# Patient Record
Sex: Female | Born: 1965 | Race: Black or African American | Hispanic: No | Marital: Single | Smoking: Never smoker
Health system: Southern US, Community
[De-identification: ages and names within clinical notes are randomized; demographics above are authoritative.]

## PROBLEM LIST (undated history)

## (undated) HISTORY — PX: BREAST EXCISIONAL BIOPSY: SUR124

---

## 2006-05-12 ENCOUNTER — Emergency Department: Payer: Self-pay | Admitting: Emergency Medicine

## 2007-12-04 ENCOUNTER — Ambulatory Visit: Payer: Self-pay | Admitting: Unknown Physician Specialty

## 2008-10-18 ENCOUNTER — Emergency Department: Payer: Self-pay | Admitting: Emergency Medicine

## 2010-11-27 ENCOUNTER — Ambulatory Visit: Payer: Self-pay | Admitting: Unknown Physician Specialty

## 2015-06-26 ENCOUNTER — Emergency Department: Payer: BLUE CROSS/BLUE SHIELD

## 2015-06-26 ENCOUNTER — Encounter: Payer: Self-pay | Admitting: Emergency Medicine

## 2015-06-26 ENCOUNTER — Emergency Department
Admission: EM | Admit: 2015-06-26 | Discharge: 2015-06-26 | Disposition: A | Discharge status: Home / Self Care | Payer: BLUE CROSS/BLUE SHIELD | Attending: Emergency Medicine | Admitting: Emergency Medicine

## 2015-06-26 DIAGNOSIS — R519 Headache, unspecified: Secondary | ICD-10-CM

## 2015-06-26 DIAGNOSIS — R51 Headache: Secondary | ICD-10-CM | POA: Insufficient documentation

## 2015-06-26 MED ORDER — METOCLOPRAMIDE HCL 10 MG PO TABS: 10.0000 mg | ORAL_TABLET | Freq: Three times a day (TID) | ORAL | Status: AC

## 2015-06-26 MED ORDER — METOCLOPRAMIDE HCL 10 MG PO TABS
10.0000 mg | ORAL_TABLET | Freq: Once | ORAL | Status: AC
  Administered 2015-06-26: 10 mg via ORAL
  Filled 2015-06-26: qty 1

## 2015-06-26 MED ORDER — TRAMADOL HCL 50 MG PO TABS
50.0000 mg | ORAL_TABLET | Freq: Once | ORAL | Status: AC
  Administered 2015-06-26: 50 mg via ORAL
  Filled 2015-06-26: qty 1

## 2015-06-26 MED ORDER — TRAMADOL HCL 50 MG PO TABS: 50.0000 mg | ORAL_TABLET | Freq: Four times a day (QID) | ORAL | Status: AC | PRN

## 2015-06-26 NOTE — ED Provider Notes (Signed)
Time Seen: Approximately 1530 I have reviewed the triage notes  Chief Complaint: Headache   History of Present Illness: Lisa Wood is a 49 y.o. female who presents with headache now intermittently for the last 2 weeks. She states the pain is relatively constant but "" worse at times "". She states is been mostly left-sided but there is been occasional right-sided occipital component. She describes some occasional trouble focusing in her left eye but no eye pain or deficits. She states she was seen one other time in the past for headache but no obvious diagnosis was made at that time. She denies any neck pain, photophobia, fever, trauma. Any focal weakness or trouble with speech or swallowing. She's been ambulatory without difficulty and is tried some over-the-counter medication without success which was Excedrin Migraine.   History reviewed. No pertinent past medical history.  There are no active problems to display for this patient.   History reviewed. No pertinent past surgical history.  History reviewed. No pertinent past surgical history.  Current Outpatient Rx  Name  Route  Sig  Dispense  Refill  . metoCLOPramide (REGLAN) 10 MG tablet   Oral   Take 1 tablet (10 mg total) by mouth 3 (three) times daily with meals.   21 tablet   1   . traMADol (ULTRAM) 50 MG tablet   Oral   Take 1 tablet (50 mg total) by mouth every 6 (six) hours as needed.   20 tablet   0     Allergies:  Review of patient's allergies indicates no known allergies.  Family History: No family history on file.  Social History: Social History  Substance Use Topics  . Smoking status: Never Smoker   . Smokeless tobacco: None  . Alcohol Use: No     Review of Systems:   10 point review of systems was performed and was otherwise negative:  Constitutional: No fever Eyes: No visual disturbances ENT: No sore throat, ear pain Cardiac: No chest pain Respiratory: No shortness of breath, wheezing,  or stridor Abdomen: No abdominal pain, no vomiting, No diarrhea Endocrine: No weight loss, No night sweats Extremities: No peripheral edema, cyanosis Skin: No rashes, easy bruising Neurologic: No focal weakness, trouble with speech or swollowing Urologic: No dysuria, Hematuria, or urinary frequency   Physical Exam:  ED Triage Vitals  Enc Vitals Group     BP 06/26/15 1334 136/90 mmHg     Pulse Rate 06/26/15 1334 77     Resp 06/26/15 1334 18     Temp 06/26/15 1334 98.3 F (36.8 C)     Temp Source 06/26/15 1334 Oral     SpO2 06/26/15 1334 99 %     Weight 06/26/15 1334 188 lb (85.276 kg)     Height 06/26/15 1334  (1.6 m)     Head Cir --      Peak Flow --      Pain Score 06/26/15 1334 2     Pain Loc --      Pain Edu? --      Excl. in GC? --     General: Awake , Alert , and Oriented times 3; GCS 15 Head: Normal cephalic , atraumatic Eyes: Pupils equal , round, reactive to light Nose/Throat: No nasal drainage, patent upper airway without erythema or exudate.  Neck: Supple, Full range of motion, No anterior adenopathy or palpable thyroid masses Lungs: Clear to ascultation without wheezes , rhonchi, or rales Heart: Regular rate, regular rhythm without  murmurs , gallops , or rubs Abdomen: Soft, non tender without rebound, guarding , or rigidity; bowel sounds positive and symmetric in all 4 quadrants. No organomegaly .        Extremities: 2 plus symmetric pulses. No edema, clubbing or cyanosis Neurologic: normal ambulation, Motor symmetric without deficits, sensory intact Skin: warm, dry, no rashes    Radiology:    CLINICAL DATA: Headache 2 weeks with blurred vision left eye.  EXAM: CT HEAD WITHOUT CONTRAST  TECHNIQUE: Contiguous axial images were obtained from the base of the skull through the vertex without intravenous contrast.  COMPARISON: 10/19/2008  FINDINGS: Ventricles, cisterns and other CSF spaces are normal. There is no mass, mass effect, shift of  midline structures or acute hemorrhage. No evidence of acute infarction. Moderate focal thinning of the left frontal skull unchanged. Remaining bones and soft tissues are within normal.  IMPRESSION: No acute intracranial findings.   I personally reviewed the radiologic studies     ED Course:  Differential diagnosis includes but is not exclusive to subarachnoid hemorrhage, meningitis, encephalitis, previous head trauma, cavernous venous thrombosis, muscle tension headache, temporal arteritis, migraine or migraine equivalent, etc. I felt given the patient's current clinical presentation and objective findings this most likely was a migraine equivalent headache. Patient was given Ultram and Reglan here in emergency department symptomatic relief.   Assessment: * Migraine equivalent headache   Final Clinical Impression:  Final diagnoses:  Acute nonintractable headache, unspecified headache type     Plan: * Outpatient management Patient was advised to return immediately if condition worsens. Patient was advised to follow up with her primary care physician or other specialized physicians involved and in their current assessment.            Jennye MoccasinBrian S Quigley, MD 06/26/15 2230

## 2015-06-26 NOTE — ED Notes (Signed)
C/o constant headache x 2 weeks, states at times pain is worse than other times, states at times she has some blurred vision to left eye, denies any blurred vision at present, never been dx with migraines

## 2015-06-26 NOTE — Discharge Instructions (Signed)
General Headache Without Cause °A headache is pain or discomfort felt around the head or neck area. There are many causes and types of headaches. In some cases, the cause may not be found.  °HOME CARE  °Managing Pain °· Take over-the-counter and prescription medicines only as told by your doctor. °· Lie down in a dark, quiet room when you have a headache. °· If directed, apply ice to the head and neck area: °· Put ice in a plastic bag. °· Place a towel between your skin and the bag. °· Leave the ice on for 20 minutes, 2-3 times per day. °· Use a heating pad or hot shower to apply heat to the head and neck area as told by your doctor. °· Keep lights dim if bright lights bother you or make your headaches worse. °Eating and Drinking °· Eat meals on a regular schedule. °· Lessen how much alcohol you drink. °· Lessen how much caffeine you drink, or stop drinking caffeine. °General Instructions °· Keep all follow-up visits as told by your doctor. This is important. °· Keep a journal to find out if certain things bring on headaches. For example, write down: °· What you eat and drink. °· How much sleep you get. °· Any change to your diet or medicines. °· Relax by getting a massage or doing other relaxing activities. °· Lessen stress. °· Sit up straight. Do not tighten (tense) your muscles. °· Do not use tobacco products. This includes cigarettes, chewing tobacco, or e-cigarettes. If you need help quitting, ask your doctor. °· Exercise regularly as told by your doctor. °· Get enough sleep. This often means 7-9 hours of sleep. °GET HELP IF: °· Your symptoms are not helped by medicine. °· You have a headache that feels different than the other headaches. °· You feel sick to your stomach (nauseous) or you throw up (vomit). °· You have a fever. °GET HELP RIGHT AWAY IF:  °· Your headache becomes really bad. °· You keep throwing up. °· You have a stiff neck. °· You have trouble seeing. °· You have trouble speaking. °· You have  pain in the eye or ear. °· Your muscles are weak or you lose muscle control. °· You lose your balance or have trouble walking. °· You feel like you will pass out (faint) or you pass out. °· You have confusion. °  °This information is not intended to replace advice given to you by your health care provider. Make sure you discuss any questions you have with your health care provider. °  °Document Released: 05/08/2008 Document Revised: 04/20/2015 Document Reviewed: 11/22/2014 °Elsevier Interactive Patient Education ©2016 Else[employer] Inc. ° °Migraine Headache °A migraine headache is an intense, throbbing pain on one or both sides of your head. A migraine can last for 30 minutes to several hours. °CAUSES  °The exact cause of a migraine headache is not always known. However, a migraine may be caused when nerves in the brain become irritated and release chemicals that cause inflammation. This causes pain. °Certain things may also trigger migraines, such as: °· Alcohol. °· Smoking. °· Stress. °· Menstruation. °· Aged cheeses. °· Foods or drinks that contain nitrates, glutamate, aspartame, or tyramine. °· Lack of sleep. °· Chocolate. °· Caffeine. °· Hunger. °· Physical exertion. °· Fatigue. °· Medicines used to treat chest pain (nitroglycerine), birth control pills, estrogen, and some blood pressure medicines. °SIGNS AND SYMPTOMS °· Pain on one or both sides of your head. °· Pulsating or throbbing pain. °· Severe   pain that prevents daily activities. °· Pain that is aggravated by any physical activity. °· Nausea, vomiting, or both. °· Dizziness. °· Pain with exposure to bright lights, loud noises, or activity. °· General sensitivity to bright lights, loud noises, or smells. °Before you get a migraine, you may get warning signs that a migraine is coming (aura). An aura may include: °· Seeing flashing lights. °· Seeing bright spots, halos, or zigzag lines. °· Having tunnel vision or blurred vision. °· Having feelings of numbness  or tingling. °· Having trouble talking. °· Having muscle weakness. °DIAGNOSIS  °A migraine headache is often diagnosed based on: °· Symptoms. °· Physical exam. °· A CT scan or MRI of your head. These imaging tests cannot diagnose migraines, but they can help rule out other causes of headaches. °TREATMENT °Medicines may be given for pain and nausea. Medicines can also be given to help prevent recurrent migraines.  °HOME CARE INSTRUCTIONS °· Only take over-the-counter or prescription medicines for pain or discomfort as directed by your health care provider. The use of long-term narcotics is not recommended. °· Lie down in a dark, quiet room when you have a migraine. °· Keep a journal to find out what may trigger your migraine headaches. For example, write down: °¨ What you eat and drink. °¨ How much sleep you get. °¨ Any change to your diet or medicines. °· Limit alcohol consumption. °· Quit smoking if you smoke. °· Get 7-9 hours of sleep, or as recommended by your health care provider. °· Limit stress. °· Keep lights dim if bright lights bother you and make your migraines worse. °SEEK IMMEDIATE MEDICAL CARE IF:  °· Your migraine becomes severe. °· You have a fever. °· You have a stiff neck. °· You have vision loss. °· You have muscular weakness or loss of muscle control. °· You start losing your balance or have trouble walking. °· You feel faint or pass out. °· You have severe symptoms that are different from your first symptoms. °MAKE SURE YOU:  °· Understand these instructions. °· Will watch your condition. °· Will get help right away if you are not doing well or get worse. °  °This information is not intended to replace advice given to you by your health care provider. Make sure you discuss any questions you have with your health care provider. °  °Document Released: 07/30/2005 Document Revised: 08/20/2014 Document Reviewed: 04/06/2013 °Elsevier Interactive Patient Education ©2016 Elsevier Inc. ° °

## 2017-04-17 ENCOUNTER — Other Ambulatory Visit: Payer: Self-pay | Admitting: Internal Medicine

## 2017-04-17 DIAGNOSIS — Z1231 Encounter for screening mammogram for malignant neoplasm of breast: Secondary | ICD-10-CM

## 2017-05-10 ENCOUNTER — Ambulatory Visit
Admission: RE | Admit: 2017-05-10 | Discharge: 2017-05-10 | Disposition: A | Discharge status: Home / Self Care | Payer: BLUE CROSS/BLUE SHIELD | Source: Ambulatory Visit | Attending: Internal Medicine | Admitting: Internal Medicine

## 2017-05-10 DIAGNOSIS — Z1231 Encounter for screening mammogram for malignant neoplasm of breast: Secondary | ICD-10-CM | POA: Insufficient documentation

## 2017-05-15 ENCOUNTER — Inpatient Hospital Stay
Admission: RE | Admit: 2017-05-15 | Discharge: 2017-05-15 | Disposition: A | Discharge status: Home / Self Care | Payer: Self-pay | Source: Ambulatory Visit | Attending: *Deleted | Admitting: *Deleted

## 2017-05-15 ENCOUNTER — Other Ambulatory Visit: Payer: Self-pay | Admitting: *Deleted

## 2017-05-15 DIAGNOSIS — Z9289 Personal history of other medical treatment: Secondary | ICD-10-CM

## 2018-04-02 ENCOUNTER — Other Ambulatory Visit: Payer: Self-pay | Admitting: Internal Medicine

## 2018-04-02 DIAGNOSIS — Z1231 Encounter for screening mammogram for malignant neoplasm of breast: Secondary | ICD-10-CM

## 2018-07-25 ENCOUNTER — Ambulatory Visit
Admission: RE | Admit: 2018-07-25 | Discharge: 2018-07-25 | Disposition: A | Discharge status: Home / Self Care | Payer: BLUE CROSS/BLUE SHIELD | Source: Ambulatory Visit | Attending: Internal Medicine | Admitting: Internal Medicine

## 2018-07-25 DIAGNOSIS — Z1231 Encounter for screening mammogram for malignant neoplasm of breast: Secondary | ICD-10-CM | POA: Insufficient documentation

## 2019-03-10 ENCOUNTER — Other Ambulatory Visit: Payer: Self-pay

## 2019-03-10 ENCOUNTER — Ambulatory Visit: Payer: Self-pay

## 2019-03-10 VITALS — BP 112/88 | HR 99 | Temp 98.9°F | Resp 20

## 2019-03-10 DIAGNOSIS — T675XXA Heat exhaustion, unspecified, initial encounter: Secondary | ICD-10-CM

## 2019-03-10 LAB — GLUCOSE, POCT (MANUAL RESULT ENTRY): POC Glucose: 109 mg/dl — AB (ref 70–99)

## 2019-03-10 NOTE — Patient Instructions (Signed)
Syncope Syncope is when you pass out (faint) for a short time. It is caused by a sudden decrease in blood flow to the brain. Signs that you may be about to pass out include:  Feeling dizzy or light-headed.  Feeling sick to your stomach (nauseous).  Seeing all white or all black.  Having cold, clammy skin. If you pass out, get help right away. Call your local emergency services (911 in the [place] Do not drive yourself to the hospital. Follow these instructions at home: Watch for any changes in your symptoms. Take these actions to stay safe and help with your symptoms: Lifestyle  Do not drive, use machinery, or play sports until your doctor says it is okay.  Do not drink alcohol.  Do not use any products that contain nicotine or tobacco, such as cigarettes and e-cigarettes. If you need help quitting, ask your doctor.  Drink enough fluid to keep your pee (urine) pale yellow. General instructions  Take over-the-counter and prescription medicines only as told by your doctor.  If you are taking blood pressure or heart medicine, sit up and stand up slowly. Spend a few minutes getting ready to sit and then stand. This can help you feel less dizzy.  Have someone stay with you until you feel stable.  If you start to feel like you might pass out, lie down right away and raise (elevate) your feet above the level of your heart. Breathe deeply and steadily. Wait until all of the symptoms are gone.  Keep all follow-up visits as told by your doctor. This is important. Get help right away if:  You have a very bad headache.  You pass out once or more than once.  You have pain in your chest, belly, or back.  You have a very fast or uneven heartbeat (palpitations).  It hurts to breathe.  You are bleeding from your mouth or your bottom (rectum).  You have black or tarry poop (stool).  You have jerky movements that you cannot control (seizure).  You are confused.  You have trouble  walking.  You are very weak.  You have vision problems. These symptoms may be an emergency. Do not wait to see if the symptoms will go away. Get medical help right away. Call your local emergency services (911 in the [place] Do not drive yourself to the hospital. Summary  Syncope is when you pass out (faint) for a short time. It is caused by a sudden decrease in blood flow to the brain.  Signs that you may be about to faint include feeling dizzy, light-headed, or sick to your stomach, seeing all white or all black, or having cold, clammy skin.  If you start to feel like you might pass out, lie down right away and raise (elevate) your feet above the level of your heart. Breathe deeply and steadily. Wait until all of the symptoms are gone. This information is not intended to replace advice given to you by your health care provider. Make sure you discuss any questions you have with your health care provider. Document Released: 01/16/2008 Document Revised: 09/11/2017 Document Reviewed: 09/11/2017 Elsevier Patient Education  2020 Elsevier Inc.   Foot LockerHeat Exhaustion Heat exhaustion happens when your body gets overheated from hot weather or from exercise. It is important to take care of yourself and to treat heat exhaustion as soon as possible. Untreated heat exhaustion can lead to heat cramps, which are the mildest form of heat-related illness. If untreated, heat exhaustion can lead  to heat stroke, which is a life-threatening condition that requires emergency care. What are the causes? Common causes of this condition include:  Exercising or being active in hot or humid weather.  Exposure to hot or humid weather.  Spending time outdoors in the bright sunshine.  Not drinking enough water.  Being dehydrated.  Being overdressed in warm weather. What increases the risk? The following factors may make you more likely to develop this condition:  Exercising beyond your fitness level.  Wearing  clothing that does not allow sweat to evaporate.  Drinking a lot of alcoholic beverages or beverages that have caffeine. This can lead to dehydration.  Being age 53 or older.  Being a child.  Having a medical condition such as heart disease, poor circulation, sickle cell disease, or high blood pressure.  Having a fever.  Being very overweight (obese).  Taking certain medicines. These include medicines for high blood pressure, antihistamines, tranquilizers, and illegal drugs such as cocaine and amphetamines. What are the signs or symptoms? Symptoms of heat exhaustion include:  Heavy sweating along with feeling weak, dizzy, light-headed, and nauseous.  Rapid heartbeat.  Headache.  Urine that is darker than normal.  Muscle cramps, such as in the leg or side (flank).  Moist, cool, and clammy skin.  Fatigue.  Thirst.  Confusion.  Fainting. Signs and symptoms may develop suddenly or over time. How is this diagnosed? This condition may be diagnosed with a physical exam. The diagnosis is confirmed when core body temperature, measured by a rectal thermometer, reaches 104.64F degrees or higher. How is this treated? This condition may be treated by having:  Cooling blankets placed on you to lower your body temperature.  Fluids given to you through an IV in a hospital. Follow these instructions at home: If you think that you have heat exhaustion, call your health care provider. Follow his or her instructions. You should also:  Ask a friend or a family member to stay with you.  Move to a cooler location, such as: ? Into the shade. ? In front of a fan. ? Into an air-conditioned space.  Lie down and rest.  Slowly drink nonalcoholic, caffeine-free fluids.  Take off tight clothing or extra clothing.  Take a cool bath or shower, if possible. If you do not have access to a bath or shower, dab or mist cool water on your skin.  If symptoms last for more than 30 minutes,  seek medical care. Contact a health care provider if:  Symptoms last for more than 30 minutes. Get help right away if:  You have any symptoms of heat stroke. These include: ? Fever. ? Vomiting. ? Red skin. ? Inability to sweat, resulting in hot, dry skin. ? Excessive thirst. ? Rapid breathing. ? Headache. ? Confusion or disorientation. ? Fainting. ? Seizures. These symptoms may represent a serious problem that is an emergency. Do not wait to see if the symptoms will go away. Get medical help right away. Call your local emergency services (911 in the [place] Do not drive yourself to the hospital. Summary  Heat exhaustion happens when your body gets overheated from hot weather, exercise, or dehydration.  Symptoms include heavy sweating, confusion, rapid heartbeat, and muscle cramps.  If your symptoms last for more than 30 minutes, contact a health care provider. This information is not intended to replace advice given to you by your health care provider. Make sure you discuss any questions you have with your health care provider. Document Released: 05/08/2008  Document Revised: 04/24/2018 Document Reviewed: 04/24/2018 Elsevier Patient Education  2020 [employer].

## 2019-03-10 NOTE — Addendum Note (Signed)
Addended by: Concha Pyo on: 03/10/2019 04:59 PM   Modules accepted: Orders

## 2019-03-10 NOTE — Progress Notes (Signed)
   Subjective:    Patient ID: Lisa Wood, female    DOB: Apr 05, 1966, 53 y.o.   MRN: [id number]  Patient presented to clinic accompanied by HR due to near syncopal episode related to heat exhaustion. Patient stated she was asked to work over and she became light headed & her hearing became diminished.blurry. She attempted to walk to the bathroom to get into a cooler environment but was unable to make it due to her vision being blurry.  Near Syncope This is a new problem. The current episode started today. The problem occurs rarely.      Review of Systems  Cardiovascular: Positive for near-syncope.  All other systems reviewed and are negative.      Objective:   Physical Exam Vitals signs reviewed.     Assessment & Plan:   Wt Readings from Last 3 Encounters:  06/26/15 188 lb (85.3 kg)   Temp Readings from Last 3 Encounters:  03/10/19 98.9 F (37.2 C) (Temporal)  06/26/15 98.3 F (36.8 C) (Oral)   BP Readings from Last 3 Encounters:  03/10/19 112/88  06/26/15 120/89   Pulse Readings from Last 3 Encounters:  03/10/19 99  06/26/15 76     No results found for: POCGLU         Thank you!!  [facility] Nurse Specialist [place]: [phone]  Cell:  [phone] Website: [email or web address]

## 2019-04-13 ENCOUNTER — Other Ambulatory Visit: Payer: Self-pay | Admitting: Physician Assistant

## 2019-04-13 DIAGNOSIS — Z1231 Encounter for screening mammogram for malignant neoplasm of breast: Secondary | ICD-10-CM

## 2019-06-04 ENCOUNTER — Ambulatory Visit: Payer: Self-pay

## 2019-06-04 ENCOUNTER — Other Ambulatory Visit: Payer: Self-pay

## 2019-06-04 VITALS — BP 110/66 | HR 73 | Resp 18 | Ht 63.0 in | Wt 171.0 lb

## 2019-06-04 DIAGNOSIS — Z008 Encounter for other general examination: Secondary | ICD-10-CM

## 2019-06-04 LAB — POCT LIPID PANEL
HDL: 75
LDL: 97
Non-HDL: 108
POC Glucose: 101 mg/dl — AB (ref 70–99)
TC/HDL: 2.4
TC: 183
TRG: 52

## 2019-06-04 NOTE — Progress Notes (Signed)
     Patient ID: Lisa Wood, female    DOB: Dec 30, 1965, 53 y.o.   MRN: [id number]    Thank you!!  [facility] Nurse Specialist [place]: [phone]  Cell:  [phone] Website: [email or web address]

## 2019-07-27 ENCOUNTER — Ambulatory Visit
Admission: RE | Admit: 2019-07-27 | Discharge: 2019-07-27 | Disposition: A | Discharge status: Home / Self Care | Payer: BC Managed Care – PPO | Source: Ambulatory Visit | Attending: Physician Assistant | Admitting: Physician Assistant

## 2019-07-27 DIAGNOSIS — Z1231 Encounter for screening mammogram for malignant neoplasm of breast: Secondary | ICD-10-CM | POA: Insufficient documentation

## 2019-09-21 ENCOUNTER — Ambulatory Visit: Payer: BC Managed Care – PPO | Attending: Internal Medicine

## 2019-09-21 DIAGNOSIS — Z20822 Contact with and (suspected) exposure to covid-19: Secondary | ICD-10-CM

## 2019-09-22 LAB — NOVEL CORONAVIRUS, NAA: SARS-CoV-2, NAA: NOT DETECTED

## 2019-10-29 ENCOUNTER — Ambulatory Visit: Payer: BC Managed Care – PPO | Attending: Internal Medicine

## 2019-10-29 DIAGNOSIS — Z23 Encounter for immunization: Secondary | ICD-10-CM

## 2019-10-29 NOTE — Progress Notes (Signed)
   Covid-19 Vaccination Clinic  Name:  Lisa Wood    MRN: [id number] DOB: 1965/10/19  10/29/2019  [name] was observed post Covid-19 immunization for 15 minutes without incident. She was provided with Vaccine Information Sheet and instruction to access the V-Safe system.   [name] was instructed to call 911 with any severe reactions post vaccine: Marland Kitchen Difficulty breathing  . Swelling of face and throat  . A fast heartbeat  . A bad rash all over body  . Dizziness and weakness   Immunizations Administered    Name Date Dose VIS Date Route   Pfizer COVID-19 Vaccine 10/29/2019 11:56 AM 0.3 mL 07/24/2019 Intramuscular   Manufacturer: [employer], [employer]   Lot: [id number]   NDC: [address]-6

## 2019-11-24 ENCOUNTER — Ambulatory Visit: Payer: BC Managed Care – PPO | Attending: Internal Medicine

## 2019-11-24 DIAGNOSIS — Z23 Encounter for immunization: Secondary | ICD-10-CM

## 2019-11-24 NOTE — Progress Notes (Signed)
   Covid-19 Vaccination Clinic  Name:  JANISHA BUESO    MRN: [id number] DOB: July 18, 1966  11/24/2019  Ms. Zupko was observed post Covid-19 immunization for 15 minutes without incident. She was provided with Vaccine Information Sheet and instruction to access the V-Safe system.   Ms. Grams was instructed to call 911 with any severe reactions post vaccine: Marland Kitchen Difficulty breathing  . Swelling of face and throat  . A fast heartbeat  . A bad rash all over body  . Dizziness and weakness   Immunizations Administered    Name Date Dose VIS Date Route   Pfizer COVID-19 Vaccine 11/24/2019  3:37 PM 0.3 mL 07/24/2019 Intramuscular   Manufacturer: [employer], [employer]   Lot: [id number]   NDC: [address]-0

## 2019-12-03 ENCOUNTER — Emergency Department
Admission: EM | Admit: 2019-12-03 | Discharge: 2019-12-03 | Disposition: A | Discharge status: Home / Self Care | Payer: BC Managed Care – PPO | Attending: Emergency Medicine | Admitting: Emergency Medicine

## 2019-12-03 ENCOUNTER — Other Ambulatory Visit: Payer: Self-pay

## 2019-12-03 DIAGNOSIS — Z79899 Other long term (current) drug therapy: Secondary | ICD-10-CM | POA: Diagnosis not present

## 2019-12-03 DIAGNOSIS — G43009 Migraine without aura, not intractable, without status migrainosus: Secondary | ICD-10-CM | POA: Insufficient documentation

## 2019-12-03 DIAGNOSIS — R519 Headache, unspecified: Secondary | ICD-10-CM | POA: Diagnosis present

## 2019-12-03 MED ORDER — DIPHENHYDRAMINE HCL 50 MG/ML IJ SOLN
25.0000 mg | Freq: Once | INTRAMUSCULAR | Status: AC
  Administered 2019-12-03: 25 mg via INTRAVENOUS
  Filled 2019-12-03: qty 1

## 2019-12-03 MED ORDER — BUTALBITAL-APAP-CAFFEINE 50-325-40 MG PO TABS: 1.0000 | ORAL_TABLET | Freq: Four times a day (QID) | ORAL | 0 refills | Status: AC | PRN

## 2019-12-03 MED ORDER — LACTATED RINGERS IV BOLUS
1000.0000 mL | Freq: Once | INTRAVENOUS | Status: AC
  Administered 2019-12-03: 08:00:00 1000 mL via INTRAVENOUS

## 2019-12-03 MED ORDER — KETOROLAC TROMETHAMINE 30 MG/ML IJ SOLN
15.0000 mg | Freq: Once | INTRAMUSCULAR | Status: AC
  Administered 2019-12-03: 15 mg via INTRAVENOUS
  Filled 2019-12-03: qty 1

## 2019-12-03 MED ORDER — PROCHLORPERAZINE EDISYLATE 10 MG/2ML IJ SOLN
10.0000 mg | Freq: Once | INTRAMUSCULAR | Status: AC
  Administered 2019-12-03: 08:00:00 10 mg via INTRAVENOUS
  Filled 2019-12-03: qty 2

## 2019-12-03 MED ORDER — DEXAMETHASONE SODIUM PHOSPHATE 10 MG/ML IJ SOLN
10.0000 mg | Freq: Once | INTRAMUSCULAR | Status: AC
  Administered 2019-12-03: 10 mg via INTRAVENOUS
  Filled 2019-12-03: qty 1

## 2019-12-03 NOTE — ED Triage Notes (Signed)
Pt in with co migraine states started last Tuesday after getting 2nd covid shot. Does have some vomiting states has had same in the past with her migraines. Has taken tramadol for same without relief.

## 2019-12-03 NOTE — ED Notes (Signed)
Pt received 2nd covid vaccine shot on the 13th and has had intermittent headache and vomitting since then. Pt went to PCP and received nausea and HA meds that only helped a little.

## 2019-12-03 NOTE — Discharge Instructions (Signed)
Drink plenty of fluids  Try to get at least 8 hours of sleep  Try small amounts of caffeine to help with the headache  If headache returns, take the Imitrex prescribed by your doctor within the first 1 hour of onset.  If headache persists, take the Ultram or Fioricet.

## 2019-12-03 NOTE — ED Provider Notes (Signed)
[facility] Emergency Department Provider Note  ____________________________________________   First MD Initiated Contact with Patient 12/03/19 [phone]     (approximate)  I have reviewed the triage vital signs and the nursing notes.   HISTORY  Chief Complaint Headache    HPI Lisa Wood is a 54 y.o. female  With PMHx migraines here with headache. Pt received her second [place] vaccination 4/13. Reports that since then she has had increased frequency of headaches. She states that 24 hr after her shot, she developed a unilateral left-sided aching, throbbing HA similar ot her usual migraines. She went to her PCP and was given tramadol and antiemetics, with mild improvement. However, this HA has returned over past 48 hours. Pain began gradually and she describes it as an aching, throbbing, left sided HA similar in quality and distribution to her usual migraines. No thunderclap onset. No vision changes, no focal numbness or weakness. No fevers. No neck pain or stiffness. No photophobia or phonophobia. No specific alleviating factors.        No past medical history on file.  There are no problems to display for this patient.   Past Surgical History:  Procedure Laterality Date  . BREAST EXCISIONAL BIOPSY Left 1990's   neg    Prior to Admission medications   Medication Sig Start Date End Date Taking? Authorizing Provider  butalbital-acetaminophen-caffeine (FIORICET) 50-325-40 MG tablet Take 1-2 tablets by mouth every 6 (six) hours as needed for migraine. 12/03/19 12/02/20  Duffy Bruce, MD  metoCLOPramide (REGLAN) 10 MG tablet Take 1 tablet (10 mg total) by mouth 3 (three) times daily with meals. 06/26/15 07/03/15  Daymon Larsen, MD  traMADol (ULTRAM) 50 MG tablet Take 1 tablet (50 mg total) by mouth every 6 (six) hours as needed. 06/26/15   Daymon Larsen, MD    Allergies Patient has no known allergies.  Family History  Problem Relation Age of  Onset  . Breast cancer Mother     Social History Social History   Tobacco Use  . Smoking status: Never Smoker  Substance Use Topics  . Alcohol use: No  . Drug use: Not on file    Review of Systems  Review of Systems  Constitutional: Positive for fatigue. Negative for fever.  HENT: Negative for congestion and sore throat.   Eyes: Negative for visual disturbance.  Respiratory: Negative for cough and shortness of breath.   Cardiovascular: Negative for chest pain.  Gastrointestinal: Positive for nausea and vomiting. Negative for abdominal pain and diarrhea.  Genitourinary: Negative for flank pain.  Musculoskeletal: Negative for back pain and neck pain.  Skin: Negative for rash and wound.  Neurological: Positive for headaches. Negative for weakness.  All other systems reviewed and are negative.    ____________________________________________  PHYSICAL EXAM:      VITAL SIGNS: ED Triage Vitals  Enc Vitals Group     BP 12/03/19 0307 133/85     Pulse Rate 12/03/19 0307 78     Resp 12/03/19 0307 20     Temp 12/03/19 0307 98.7 F (37.1 C)     Temp Source 12/03/19 0307 Oral     SpO2 12/03/19 0307 100 %     Weight 12/03/19 0308 172 lb (78 kg)     Height 12/03/19 0308 5\' 3"  (1.6 m)     Head Circumference --      Peak Flow --      Pain Score 12/03/19 0308 10     Pain Loc --  Pain Edu? --      Excl. in GC? --      Physical Exam Vitals and nursing note reviewed.  Constitutional:      General: She is not in acute distress.    Appearance: She is well-developed.  HENT:     Head: Normocephalic and atraumatic.  Eyes:     Conjunctiva/sclera: Conjunctivae normal.  Cardiovascular:     Rate and Rhythm: Normal rate and regular rhythm.     Heart sounds: Normal heart sounds. No murmur. No friction rub.  Pulmonary:     Effort: Pulmonary effort is normal. No respiratory distress.     Breath sounds: Normal breath sounds. No wheezing or rales.  Abdominal:     General: There  is no distension.     Palpations: Abdomen is soft.     Tenderness: There is no abdominal tenderness.  Musculoskeletal:     Cervical back: Neck supple.  Skin:    General: Skin is warm.     Capillary Refill: Capillary refill takes less than 2 seconds.  Neurological:     Mental Status: She is alert and oriented to person, place, and time.     Motor: No abnormal muscle tone.     Comments: Neurological Exam:  Mental Status: Alert and oriented to person, place, and time. Attention and concentration normal. Speech clear. Recent memory is intact. Cranial Nerves: Visual fields grossly intact. EOMI and PERRLA. No nystagmus noted. Facial sensation intact at forehead, maxillary cheek, and chin/mandible bilaterally. No facial asymmetry or weakness. Hearing grossly normal. Uvula is midline, and palate elevates symmetrically. Normal SCM and trapezius strength. Tongue midline without fasciculations. Motor: Muscle strength 5/5 in proximal and distal UE and LE bilaterally. No pronator drift. Muscle tone normal.  Sensation: Intact to light touch in upper and lower extremities distally bilaterally.  Gait: Normal without ataxia. Coordination: Normal FTN bilaterally.          ____________________________________________   LABS (all labs ordered are listed, but only abnormal results are displayed)  Labs Reviewed - No data to display  ____________________________________________  EKG: None ________________________________________  RADIOLOGY All imaging, including plain films, CT scans, and ultrasounds, independently reviewed by me, and interpretations confirmed via formal radiology reads.  ED MD interpretation:   None  Official radiology report(s): No results found.  ____________________________________________  PROCEDURES   Procedure(s) performed (including Critical Care):  Procedures  ____________________________________________  INITIAL IMPRESSION / MDM / ASSESSMENT AND PLAN / ED  COURSE  As part of my medical decision making, I reviewed the following data within the electronic MEDICAL RECORD NUMBER Nursing notes reviewed and incorporated, Old chart reviewed, Notes from prior ED visits, and [place] Controlled Substance Database       *ELISABETH STROM was evaluated in Emergency Department on 12/03/2019 for the symptoms described in the history of present illness. She was evaluated in the context of the global COVID-19 pandemic, which necessitated consideration that the patient might be at risk for infection with the SARS-CoV-2 virus that causes COVID-19. Institutional protocols and algorithms that pertain to the evaluation of patients at risk for COVID-19 are in a state of rapid change based on information released by regulatory bodies including the CDC and federal and state organizations. These policies and algorithms were followed during the patient's care in the ED.  Some ED evaluations and interventions may be delayed as a result of limited staffing during the pandemic.*  Clinical Course as of Dec 02 1944  Thu Dec 03, 2019  0845  54 yo F here with unilateral left-sided HA after [place] vaccination. No focal neurological deficits. HDS. Suspect recurrent migraine. No fever, meningismus, or signs of meningitis or encephalitis. HA is similar in quality, location to her usual migraines so doubt SAH. Will treat with migraine meds, fluids, and re-assess.   [CI]  0932 HA is markedly improved, feeling much better. Tolerating PO. Will dc with supportive care.   [CI]    Clinical Course User Index [CI] Shaune Pollack, MD    Medical Decision Making:  As above.  ____________________________________________  FINAL CLINICAL IMPRESSION(S) / ED DIAGNOSES  Final diagnoses:  Migraine without aura and without status migrainosus, not intractable     MEDICATIONS GIVEN DURING THIS VISIT:  Medications  lactated ringers bolus 1,000 mL (0 mLs Intravenous Stopped 12/03/19 0953)  prochlorperazine  (COMPAZINE) injection 10 mg (10 mg Intravenous Given 12/03/19 0818)  diphenhydrAMINE (BENADRYL) injection 25 mg (25 mg Intravenous Given 12/03/19 0816)  ketorolac (TORADOL) 30 MG/ML injection 15 mg (15 mg Intravenous Given 12/03/19 0817)  dexamethasone (DECADRON) injection 10 mg (10 mg Intravenous Given 12/03/19 1025)     ED Discharge Orders         Ordered    butalbital-acetaminophen-caffeine (FIORICET) 50-325-40 MG tablet  Every 6 hours PRN     12/03/19 0933           Note:  This document was prepared using Dragon voice recognition software and may include unintentional dictation errors.   Shaune Pollack, MD 12/03/19 1946

## 2019-12-03 NOTE — ED Notes (Signed)
Provided warm blankets for pt, TV remote and dimmed lights per request.

## 2019-12-03 NOTE — ED Notes (Signed)
ED Provider at bedside. 

## 2019-12-29 ENCOUNTER — Other Ambulatory Visit: Payer: Self-pay | Admitting: Physician Assistant

## 2019-12-29 DIAGNOSIS — R519 Headache, unspecified: Secondary | ICD-10-CM

## 2019-12-29 DIAGNOSIS — G43009 Migraine without aura, not intractable, without status migrainosus: Secondary | ICD-10-CM

## 2020-01-27 ENCOUNTER — Ambulatory Visit: Payer: BC Managed Care – PPO

## 2020-07-18 ENCOUNTER — Other Ambulatory Visit: Payer: Self-pay | Admitting: Physician Assistant

## 2020-07-18 DIAGNOSIS — Z1231 Encounter for screening mammogram for malignant neoplasm of breast: Secondary | ICD-10-CM

## 2020-07-27 ENCOUNTER — Ambulatory Visit
Admission: RE | Admit: 2020-07-27 | Discharge: 2020-07-27 | Disposition: A | Discharge status: Home / Self Care | Payer: BC Managed Care – PPO | Source: Ambulatory Visit | Attending: Physician Assistant | Admitting: Physician Assistant

## 2020-07-27 ENCOUNTER — Other Ambulatory Visit: Payer: Self-pay

## 2020-07-27 DIAGNOSIS — Z1231 Encounter for screening mammogram for malignant neoplasm of breast: Secondary | ICD-10-CM | POA: Diagnosis present

## 2020-07-29 ENCOUNTER — Other Ambulatory Visit: Payer: Self-pay | Admitting: Orthopedic Surgery

## 2020-07-29 DIAGNOSIS — R29898 Other symptoms and signs involving the musculoskeletal system: Secondary | ICD-10-CM

## 2020-07-29 DIAGNOSIS — M75111 Incomplete rotator cuff tear or rupture of right shoulder, not specified as traumatic: Secondary | ICD-10-CM

## 2020-08-09 ENCOUNTER — Other Ambulatory Visit: Payer: Self-pay

## 2020-08-09 ENCOUNTER — Ambulatory Visit
Admission: RE | Admit: 2020-08-09 | Discharge: 2020-08-09 | Disposition: A | Discharge status: Home / Self Care | Payer: BC Managed Care – PPO | Source: Ambulatory Visit | Attending: Orthopedic Surgery | Admitting: Orthopedic Surgery

## 2020-08-09 DIAGNOSIS — R29898 Other symptoms and signs involving the musculoskeletal system: Secondary | ICD-10-CM | POA: Insufficient documentation

## 2020-08-09 DIAGNOSIS — M75111 Incomplete rotator cuff tear or rupture of right shoulder, not specified as traumatic: Secondary | ICD-10-CM | POA: Diagnosis present

## 2021-06-27 ENCOUNTER — Other Ambulatory Visit: Payer: Self-pay | Admitting: Physician Assistant

## 2021-06-27 DIAGNOSIS — Z1231 Encounter for screening mammogram for malignant neoplasm of breast: Secondary | ICD-10-CM

## 2021-07-28 ENCOUNTER — Other Ambulatory Visit: Payer: Self-pay

## 2021-07-28 ENCOUNTER — Ambulatory Visit
Admission: RE | Admit: 2021-07-28 | Discharge: 2021-07-28 | Disposition: A | Discharge status: Home / Self Care | Payer: BC Managed Care – PPO | Source: Ambulatory Visit | Attending: Physician Assistant | Admitting: Physician Assistant

## 2021-07-28 DIAGNOSIS — Z1231 Encounter for screening mammogram for malignant neoplasm of breast: Secondary | ICD-10-CM | POA: Diagnosis present

## 2022-05-08 ENCOUNTER — Other Ambulatory Visit: Payer: Self-pay | Admitting: Physician Assistant

## 2022-05-08 DIAGNOSIS — Z1231 Encounter for screening mammogram for malignant neoplasm of breast: Secondary | ICD-10-CM

## 2022-07-31 ENCOUNTER — Ambulatory Visit
Admission: RE | Admit: 2022-07-31 | Discharge: 2022-07-31 | Disposition: A | Discharge status: Home / Self Care | Payer: BC Managed Care – PPO | Source: Ambulatory Visit | Attending: Physician Assistant | Admitting: Physician Assistant

## 2022-07-31 DIAGNOSIS — Z1231 Encounter for screening mammogram for malignant neoplasm of breast: Secondary | ICD-10-CM | POA: Insufficient documentation

## 2023-04-22 ENCOUNTER — Ambulatory Visit: Payer: BC Managed Care – PPO

## 2023-04-22 DIAGNOSIS — Z86010 Personal history of colonic polyps: Secondary | ICD-10-CM | POA: Diagnosis not present

## 2023-04-22 DIAGNOSIS — K5730 Diverticulosis of large intestine without perforation or abscess without bleeding: Secondary | ICD-10-CM | POA: Diagnosis not present

## 2023-04-22 DIAGNOSIS — K635 Polyp of colon: Secondary | ICD-10-CM | POA: Diagnosis not present

## 2023-04-22 DIAGNOSIS — Z1211 Encounter for screening for malignant neoplasm of colon: Secondary | ICD-10-CM | POA: Diagnosis present

## 2023-04-22 DIAGNOSIS — K640 First degree hemorrhoids: Secondary | ICD-10-CM | POA: Diagnosis not present

## 2023-04-29 ENCOUNTER — Other Ambulatory Visit: Payer: Self-pay | Admitting: Physician Assistant

## 2023-04-29 DIAGNOSIS — Z1231 Encounter for screening mammogram for malignant neoplasm of breast: Secondary | ICD-10-CM

## 2023-08-13 ENCOUNTER — Ambulatory Visit
Admission: RE | Admit: 2023-08-13 | Discharge: 2023-08-13 | Disposition: A | Discharge status: Home / Self Care | Payer: BC Managed Care – PPO | Source: Ambulatory Visit | Attending: Physician Assistant | Admitting: Physician Assistant

## 2023-08-13 DIAGNOSIS — Z1231 Encounter for screening mammogram for malignant neoplasm of breast: Secondary | ICD-10-CM | POA: Insufficient documentation

## 2023-10-28 IMAGING — MG MM DIGITAL SCREENING BILAT W/ TOMO AND CAD
8 series · 8 of 24 positions shown · non-contrast
Comparison: Previous exam(s).

CLINICAL DATA: Screening.

EXAM:
DIGITAL SCREENING BILATERAL MAMMOGRAM WITH TOMOSYNTHESIS AND CAD
TECHNIQUE: Bilateral screening digital craniocaudal and mediolateral oblique
mammograms were obtained. Bilateral screening digital breast
tomosynthesis was performed. The images were evaluated with
computer-aided detection.

[L MLO synth-2D]
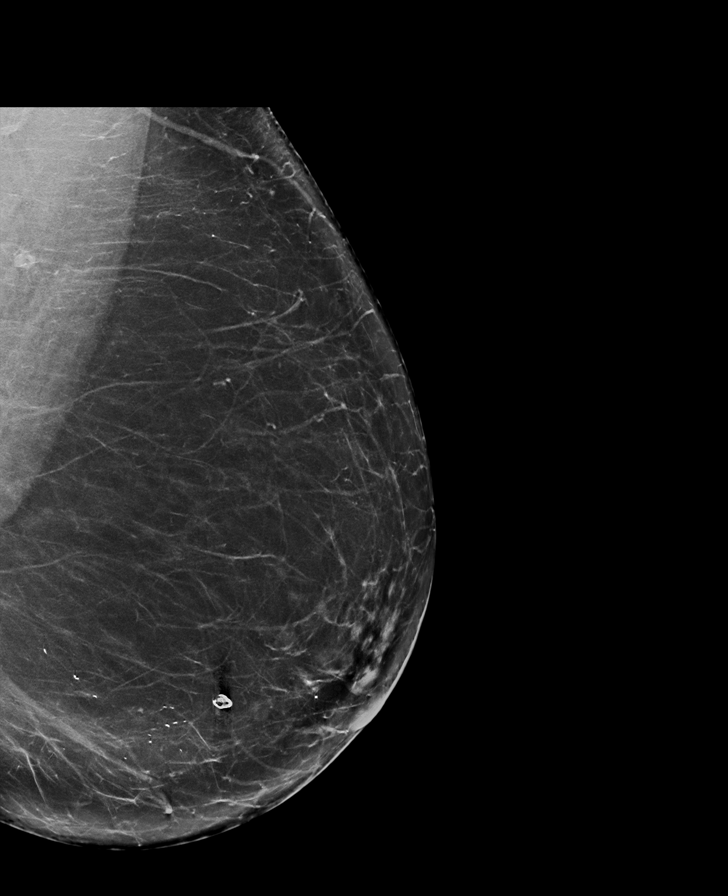

[L CC synth-2D]
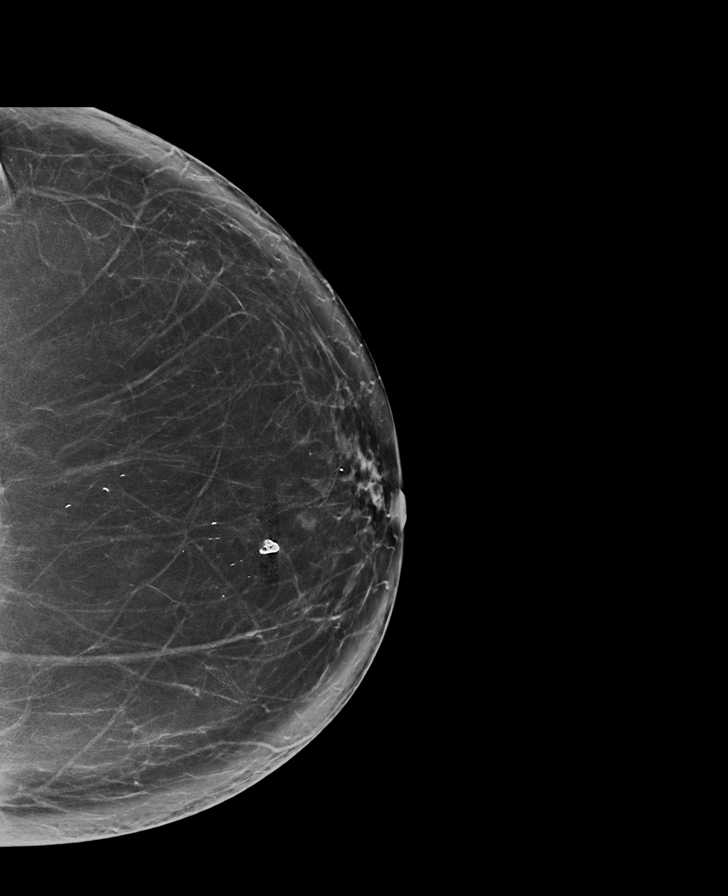

[R MLO synth-2D]
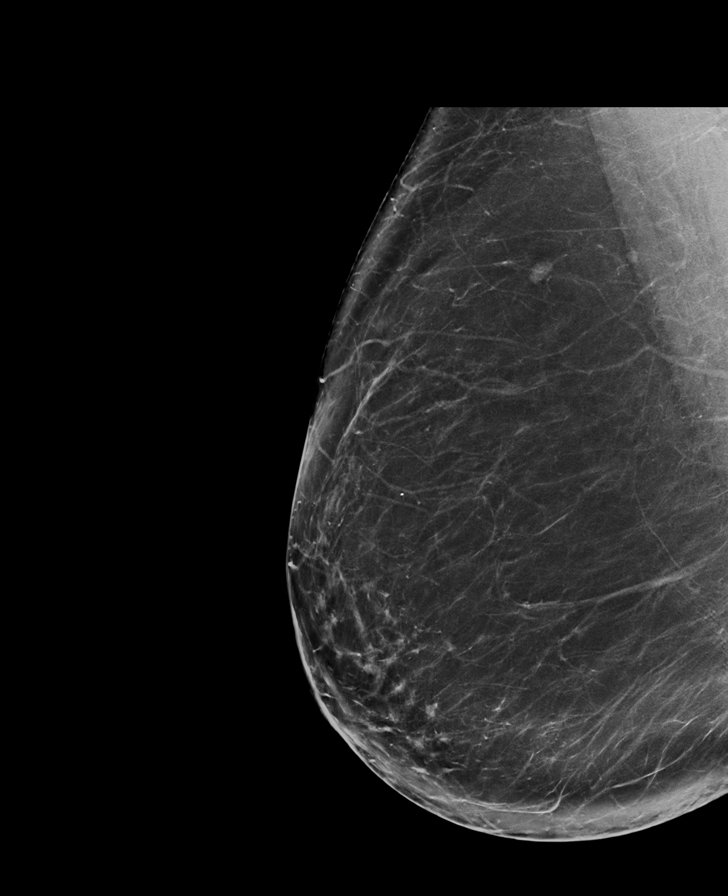

[R CC synth-2D]
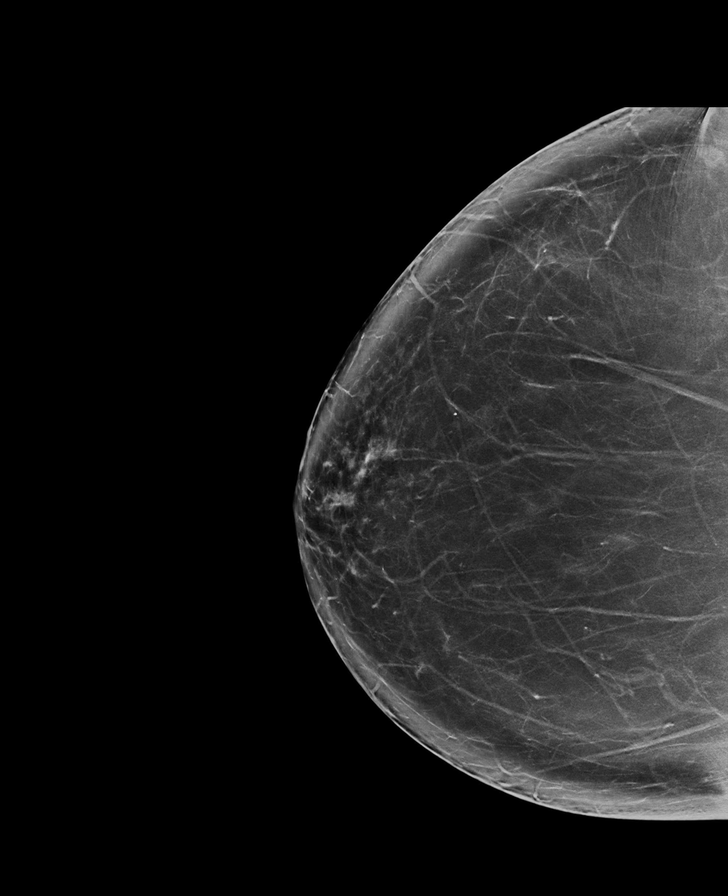

[R MLO tomo · tomo slice 45/89.0]
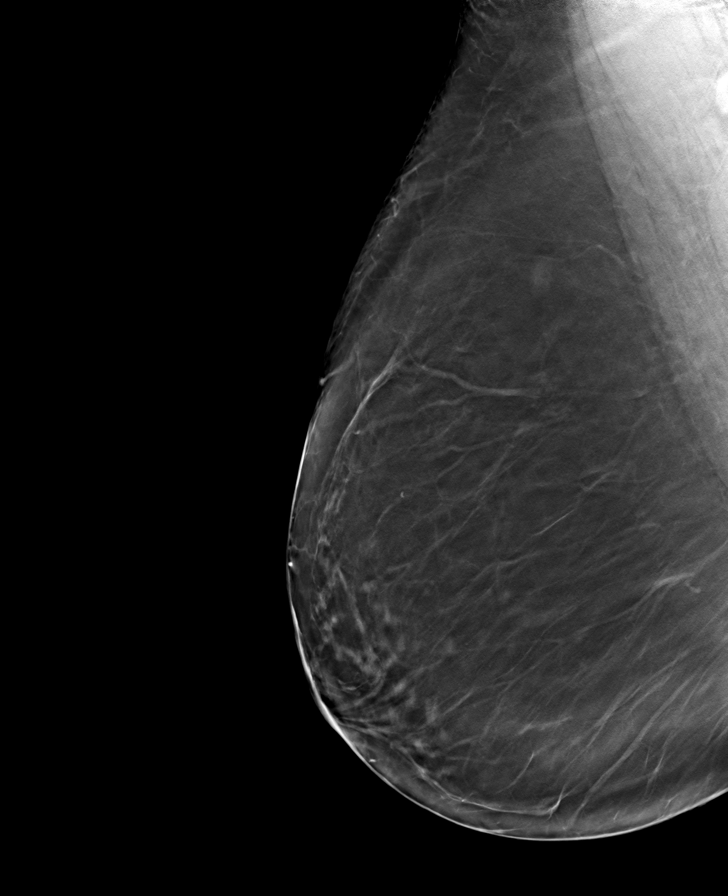

[L MLO tomo · tomo slice 43/85.0]
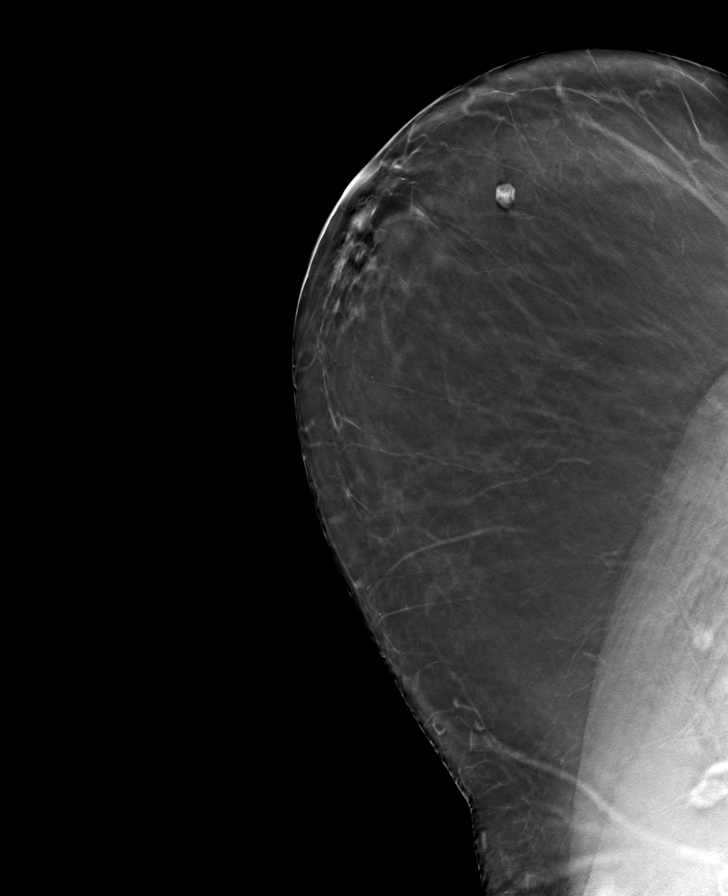

[R CC tomo · tomo slice 45/90.0]
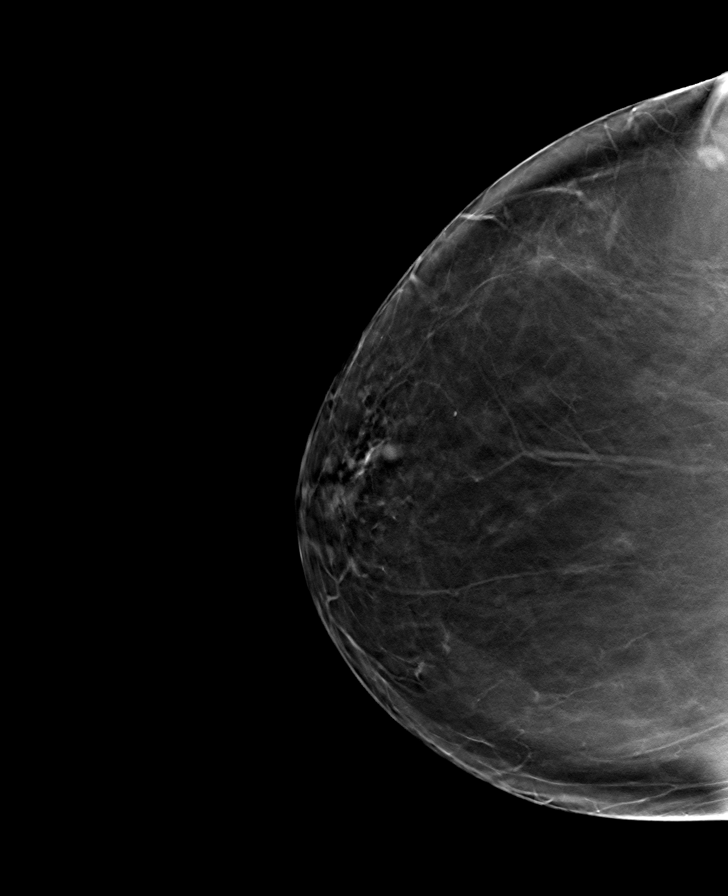

[L CC tomo · tomo slice 45/88.0]
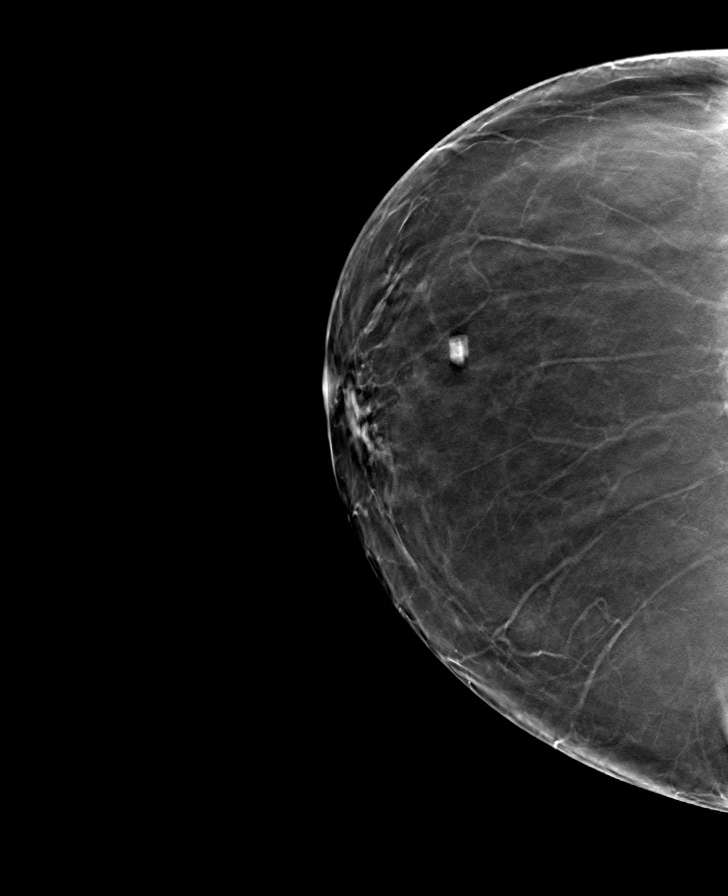

[8 of 24 positions shown; findings below may reference images not displayed]

ACR Breast Density Category b: There are scattered areas of
fibroglandular density.
FINDINGS: There are no findings suspicious for malignancy.
IMPRESSION: No mammographic evidence of malignancy. A result letter of this
screening mammogram will be mailed directly to the patient.

RECOMMENDATION:
Screening mammogram in one year. (Code:51-O-LD2)

BI-RADS CATEGORY  1: Negative.

## 2024-03-23 ENCOUNTER — Other Ambulatory Visit: Payer: Self-pay | Admitting: Physician Assistant

## 2024-03-23 DIAGNOSIS — Z1231 Encounter for screening mammogram for malignant neoplasm of breast: Secondary | ICD-10-CM

## 2024-08-14 ENCOUNTER — Ambulatory Visit
Admission: RE | Admit: 2024-08-14 | Discharge: 2024-08-14 | Disposition: A | Discharge status: Home / Self Care | Source: Ambulatory Visit | Attending: Physician Assistant | Admitting: Physician Assistant

## 2024-08-14 DIAGNOSIS — Z1231 Encounter for screening mammogram for malignant neoplasm of breast: Secondary | ICD-10-CM | POA: Insufficient documentation
